# Patient Record
Sex: Male | Born: 1985 | Hispanic: No | Marital: Married | Smoking: Never smoker
Health system: Southern US, Community
[De-identification: ages and names within clinical notes are randomized; demographics above are authoritative.]

---

## 2015-09-12 HISTORY — PX: FRACTURE SURGERY: SHX138

## 2015-10-20 ENCOUNTER — Emergency Department (HOSPITAL_COMMUNITY): Payer: Self-pay

## 2015-10-20 ENCOUNTER — Encounter (HOSPITAL_COMMUNITY): Payer: Self-pay | Admitting: *Deleted

## 2015-10-20 ENCOUNTER — Emergency Department (HOSPITAL_COMMUNITY)
Admission: EM | Admit: 2015-10-20 | Discharge: 2015-10-20 | Disposition: A | Discharge status: Home / Self Care | Payer: Self-pay | Attending: Emergency Medicine | Admitting: Emergency Medicine

## 2015-10-20 DIAGNOSIS — Y9289 Other specified places as the place of occurrence of the external cause: Secondary | ICD-10-CM | POA: Insufficient documentation

## 2015-10-20 DIAGNOSIS — Y9389 Activity, other specified: Secondary | ICD-10-CM | POA: Insufficient documentation

## 2015-10-20 DIAGNOSIS — Y998 Other external cause status: Secondary | ICD-10-CM | POA: Insufficient documentation

## 2015-10-20 DIAGNOSIS — W19XXXA Unspecified fall, initial encounter: Secondary | ICD-10-CM

## 2015-10-20 DIAGNOSIS — S52121A Displaced fracture of head of right radius, initial encounter for closed fracture: Secondary | ICD-10-CM | POA: Insufficient documentation

## 2015-10-20 DIAGNOSIS — W01198A Fall on same level from slipping, tripping and stumbling with subsequent striking against other object, initial encounter: Secondary | ICD-10-CM | POA: Insufficient documentation

## 2015-10-20 DIAGNOSIS — R1110 Vomiting, unspecified: Secondary | ICD-10-CM | POA: Insufficient documentation

## 2015-10-20 DIAGNOSIS — R42 Dizziness and giddiness: Secondary | ICD-10-CM | POA: Insufficient documentation

## 2015-10-20 LAB — CBC
HCT: 43.3 % (ref 39.0–52.0)
Hemoglobin: 14.5 g/dL (ref 13.0–17.0)
MCH: 24.6 pg — AB (ref 26.0–34.0)
MCHC: 33.5 g/dL (ref 30.0–36.0)
MCV: 73.5 fL — AB (ref 78.0–100.0)
PLATELETS: 284 10*3/uL (ref 150–400)
RBC: 5.89 MIL/uL — ABNORMAL HIGH (ref 4.22–5.81)
RDW: 13.4 % (ref 11.5–15.5)
WBC: 7.7 10*3/uL (ref 4.0–10.5)

## 2015-10-20 LAB — BASIC METABOLIC PANEL
Anion gap: 12 (ref 5–15)
BUN: 11 mg/dL (ref 6–20)
CHLORIDE: 105 mmol/L (ref 101–111)
CO2: 21 mmol/L — AB (ref 22–32)
CREATININE: 1.55 mg/dL — AB (ref 0.61–1.24)
Calcium: 9.5 mg/dL (ref 8.9–10.3)
GFR calc Af Amer: >60 mL/min (ref >60)
GFR calc non Af Amer: 59 mL/min — ABNORMAL LOW (ref >60)
Glucose, Bld: 96 mg/dL (ref 65–99)
Potassium: 4.6 mmol/L (ref 3.5–5.1)
Sodium: 138 mmol/L (ref 135–145)

## 2015-10-20 LAB — CBG MONITORING, ED: Glucose-Capillary: 86 mg/dL (ref 65–99)

## 2015-10-20 MED ORDER — MORPHINE SULFATE (PF) 2 MG/ML IV SOLN
2.0000 mg | Freq: Once | INTRAVENOUS | Status: AC
  Administered 2015-10-20: 2 mg via INTRAVENOUS
  Filled 2015-10-20: qty 1

## 2015-10-20 MED ORDER — OXYCODONE-ACETAMINOPHEN 5-325 MG PO TABS: 2.0000 | ORAL_TABLET | ORAL | Status: DC | PRN

## 2015-10-20 MED ORDER — SODIUM CHLORIDE 0.9 % IV BOLUS (SEPSIS)
1000.0000 mL | Freq: Once | INTRAVENOUS | Status: AC
  Administered 2015-10-20: 1000 mL via INTRAVENOUS

## 2015-10-20 NOTE — ED Provider Notes (Signed)
CSN: [id number]     Arrival date & time 10/20/15  1356 History   First MD Initiated Contact with Patient 10/20/15 1412     Chief Complaint  Patient presents with  . Dizziness  . Hypotension   (Consider location/radiation/quality/duration/timing/severity/associated sxs/prior Treatment) Patient is a 30 y.o. male presenting with dizziness. The history is provided by the patient. No language interpreter was used.  Dizziness Associated symptoms: vomiting   Associated symptoms: no chest pain, no nausea and no shortness of breath    Jonathon Vasquez is a 30 year old male who presents via EMS for trip and fall of her speed bump while chasing someone.  He is a Emergency planning/management officer. He states he became lightheaded and dizzy afterwards and vomited once. Per EMS his blood pressure was 70/40 and he was given Zofran. He is also complaining of right elbow pain as he caught himself with the right hand while tripping. He denies any head injury or loss of consciousness. Last tetanus was 5 years ago. Patient is right-handed. He denies any numbness or tingling.   History reviewed. No pertinent past medical history. History reviewed. No pertinent past surgical history. No family history on file. Social History  Substance Use Topics  . Smoking status: Never Smoker   . Smokeless tobacco: None  . Alcohol Use: No    Review of Systems  Respiratory: Negative for shortness of breath.   Cardiovascular: Negative for chest pain.  Gastrointestinal: Positive for vomiting. Negative for nausea and abdominal pain.  Musculoskeletal: Positive for myalgias and arthralgias. Negative for back pain.  Neurological: Positive for dizziness.  All other systems reviewed and are negative.     Allergies  Review of patient's allergies indicates no known allergies.  Home Medications   Prior to Admission medications   Medication Sig Start Date End Date Taking? Authorizing Provider  oxyCODONE-acetaminophen (PERCOCET/ROXICET) 5-325 MG  tablet Take 2 tablets by mouth every 4 (four) hours as needed for severe pain. 10/20/15   Devantae Babe Patel-Mills, PA-C   BP 111/81 mmHg  Pulse 93  Temp(Src) 97.6 F (36.4 C) (Oral)  Resp 15  Ht  (1.727 m)  Wt 77.111 kg  BMI 25.85 kg/m2  SpO2 100% Physical Exam  Constitutional: He is oriented to person, place, and time. He appears well-developed and well-nourished.  HENT:  Head: Normocephalic and atraumatic.  Eyes: Conjunctivae are normal.  Neck: Normal range of motion. Neck supple.  Cardiovascular: Normal rate, regular rhythm and normal heart sounds.   Pulmonary/Chest: Effort normal and breath sounds normal.  Abdominal: Soft. There is no tenderness.  Musculoskeletal: He exhibits tenderness.  Right arm: Unable to flex past 90 at the elbow. In with palpation of the proximal radial and ulna. Joint effusion or swelling. No wrist pain. 2+ radial pulses. Less than 2 second capillary refill. 5 out of 5 grip strength. Able to move shoulder without difficulty or pain.  Abrasions along the palmar surface of the hand.  Moving lower extremities without difficulty.  Neurological: He is alert and oriented to person, place, and time.  Skin: Skin is warm and dry.  Nursing note and vitals reviewed.   ED Course  Procedures (including critical care time) Labs Review Labs Reviewed  BASIC METABOLIC PANEL - Abnormal; Notable for the following:    CO2 21 (*)    Creatinine, Ser 1.55 (*)    GFR calc non Af Amer 59 (*)    All other components within normal limits  CBC - Abnormal; Notable for the following:  RBC 5.89 (*)    MCV 73.5 (*)    MCH 24.6 (*)    All other components within normal limits  CBG MONITORING, ED    Imaging Review Dg Elbow Complete Right  10/20/2015  CLINICAL DATA:  Tripped and fell chasing another person. Pain and swelling. EXAM: RIGHT ELBOW - COMPLETE 3+ VIEW COMPARISON:  None. FINDINGS: There is a radial head fracture with 2 mm of depression. There is a joint effusion.  No humeral or radial fractures seen. IMPRESSION: Radial head fracture extending to the articular surface with 2 mm of depression. Joint effusion. Electronically Signed   By: Paulina Fusi M.D.   On: 10/20/2015 15:24   I have personally reviewed and evaluated these images and lab results as part of my medical decision-making.   EKG Interpretation None      MDM   Final diagnoses:  Fall  Radial head fracture, right, closed, initial encounter   Patient presents for right arm injury after fall while chasing someone. Patient is calm. He was hypotensive and dizzy while in route with EMS. He was given a bolus of fluids while in the ED and  of morphine. He is neurovascularly intact and exam is not concerning. Denies loss of consciousness or head injury. X-ray of the right elbow shows radial head fracture extending to the intra-articular surface with 2 mm of depression. He also has a joint effusion. Patient was given arm sling. I discussed follow-up with orthopedics. He was told to take Tylenol or Motrin for pain and Percocet for breakthrough pain. He was also given a work note. He is currently unable to drive or do his job. Return precautions were discussed as well as pain management and patient agrees with plan.  Medications  sodium chloride 0.9 % bolus 1,000 mL (1,000 mLs Intravenous New Bag/Given 10/20/15 1500)  morphine 2 MG/ML injection 2 mg (2 mg Intravenous Given 10/20/15 1500)   Filed Vitals:   10/20/15 1600 10/20/15 1607  BP: 111/81   Pulse: 94 93  Temp:    Resp: 14 [address] PA-C 10/20/15 1622  Alvira Monday, MD 10/21/15 [phone]

## 2015-10-20 NOTE — ED Notes (Addendum)
Cleaned pt hand with saline, then wrapped pt hand with gauze and curlex. Arm sling at bedside.

## 2015-10-20 NOTE — ED Notes (Signed)
Pt presents via [facility] for hypotension and dizziness.  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  Pt then became lightheaded, dizzy, and began vomiting.  EMS arrived BP 70/40, 4 Zofran given and 300cc NS.  Pt also c/o right elbow pain, tripped on speed bump and feel onto arm, -LOC.  BP-90/50 P-111 R-20, EKG ST, A x 4, NAD.  18 gLAC.

## 2015-10-20 NOTE — ED Notes (Signed)
Patient transported to X-ray 

## 2015-10-20 NOTE — Discharge Instructions (Signed)
Radial Head Fracture Follow-up with orthopedics. Take Tylenol or Motrin for pain and Percocet for breakthrough pain. Do not drive or work while using this medication. A radial head fracture is a break of the radius bone in the forearm. The radial head is the part of the bone near the elbow. These breaks often happen during a fall when you land on the outstretched arm.  HOME CARE  Raise (elevate) the injured part while sitting or lying down.  Put ice on the injured area.  Put ice in a plastic bag.  Place a towel between your skin and the bag.  Leave the ice on for 15-20 minutes, 03-04 times a day.  Move your fingers.  If you have a plaster or fiberglass cast:  Do not try to scratch the skin under the cast.  Check the skin around the cast every day. Put lotion on any red or sore areas if needed.  Keep your cast dry and clean.  If you have a plaster splint:  Wear the splint as told.  Loosen the elastic around the splint if your fingers become numb, tingle, or turn cold or blue.  Do not put pressure on any part of your cast or splint. Rest your cast on a pillow for the first 24 hours.  Protect your cast or splint during bathing with a plastic bag. Do not put the cast or splint in water.  Only take medicine as told by your doctor.  Follow up with your doctor. This is important.  Do not over do exercises. GET HELP RIGHT AWAY IF:   Your cast or splint gets damaged or breaks.  You have more pain or puffiness (swelling) than you did before getting the cast.  You have severe pain when stretching your fingers.  There is a bad smell, new stains or yellowish white fluid (pus) coming from under the cast.  Your fingers or hand turn pale, blue, or become cold or lose feeling (numb). MAKE SURE YOU:  Understand these instructions.  Will watch your condition.  Will get help right away if you are not doing well or get worse.   This information is not intended to replace advice  given to you by your health care provider. Make sure you discuss any questions you have with your health care provider.   Document Released: 08/16/2009 Document Revised: 11/20/2011 Document Reviewed: 03/10/2015 Elsevier Interactive Patient Education [employer]

## 2016-06-26 ENCOUNTER — Ambulatory Visit (INDEPENDENT_AMBULATORY_CARE_PROVIDER_SITE_OTHER): Payer: Worker's Compensation | Admitting: Orthopaedic Surgery

## 2016-06-26 DIAGNOSIS — S52121D Displaced fracture of head of right radius, subsequent encounter for closed fracture with routine healing: Secondary | ICD-10-CM | POA: Diagnosis not present

## 2016-06-26 DIAGNOSIS — M25521 Pain in right elbow: Secondary | ICD-10-CM

## 2016-07-13 ENCOUNTER — Other Ambulatory Visit (INDEPENDENT_AMBULATORY_CARE_PROVIDER_SITE_OTHER): Payer: Self-pay

## 2016-07-13 DIAGNOSIS — S5291XA Unspecified fracture of right forearm, initial encounter for closed fracture: Secondary | ICD-10-CM

## 2016-07-14 ENCOUNTER — Telehealth (INDEPENDENT_AMBULATORY_CARE_PROVIDER_SITE_OTHER): Payer: Self-pay | Admitting: Orthopaedic Surgery

## 2016-07-14 DIAGNOSIS — S5291XA Unspecified fracture of right forearm, initial encounter for closed fracture: Secondary | ICD-10-CM

## 2016-07-14 NOTE — Telephone Encounter (Signed)
Annice PihJackie states the patient's order from us states the patient should continue physical therapy and only lists 1 visit. Please reword stating exactly how often this patient needs to go to physical therapy. Ex: Once a week for 4 weeks, otherwise workers comp will not allow patient to continue.

## 2016-07-18 ENCOUNTER — Telehealth (INDEPENDENT_AMBULATORY_CARE_PROVIDER_SITE_OTHER): Payer: Self-pay | Admitting: Orthopaedic Surgery

## 2016-07-18 NOTE — Telephone Encounter (Signed)
Patient calling to find out if he is to continue with physical therapy or not for his R arm. He brought in an evaluation about a week ago.

## 2016-07-18 NOTE — Telephone Encounter (Signed)
Agree with continued light duty and PT- make sure nhe has a F/U visit in next month

## 2016-07-18 NOTE — Telephone Encounter (Signed)
Check with Dr Cleophas DunkerWhitfield, please

## 2016-07-18 NOTE — Telephone Encounter (Signed)
Last dictation noted on 06/26/16 stated he will continue light duty and PT until next scheduled visit.  Please advise

## 2016-07-18 NOTE — Telephone Encounter (Signed)
Please see previous notes from phone calls and advise.

## 2016-07-19 ENCOUNTER — Encounter (INDEPENDENT_AMBULATORY_CARE_PROVIDER_SITE_OTHER): Payer: Self-pay

## 2016-07-19 NOTE — Telephone Encounter (Signed)
Spoke with pt and he needs a work note since he has completed PT. PW wasnts him on light duty and continue with PT until his visit on 07/29/16. Left work note at front desk

## 2016-07-20 ENCOUNTER — Encounter (INDEPENDENT_AMBULATORY_CARE_PROVIDER_SITE_OTHER): Payer: Self-pay

## 2016-07-21 ENCOUNTER — Telehealth (INDEPENDENT_AMBULATORY_CARE_PROVIDER_SITE_OTHER): Payer: Self-pay | Admitting: Orthopaedic Surgery

## 2016-07-21 NOTE — Telephone Encounter (Signed)
Marylu LundJanet from [employer] is requesting office notes from 06/26/16 office visit since patient is a worker's comp patient. Please fax to #[phone]

## 2016-07-25 NOTE — Telephone Encounter (Signed)
Sent these notes 07/20/16

## 2016-07-28 ENCOUNTER — Ambulatory Visit (INDEPENDENT_AMBULATORY_CARE_PROVIDER_SITE_OTHER): Payer: Worker's Compensation | Admitting: Orthopaedic Surgery

## 2016-07-28 ENCOUNTER — Encounter (INDEPENDENT_AMBULATORY_CARE_PROVIDER_SITE_OTHER): Payer: Self-pay | Admitting: Orthopaedic Surgery

## 2016-07-28 VITALS — BP 119/73 | HR 71 | Resp 14 | Ht 68.0 in | Wt 170.0 lb

## 2016-07-28 DIAGNOSIS — S52121D Displaced fracture of head of right radius, subsequent encounter for closed fracture with routine healing: Secondary | ICD-10-CM

## 2016-07-28 NOTE — Progress Notes (Signed)
   Office Visit Note   Patient: Jonathon Vasquez           Date of Birth: 1986/04/23           MRN: [id number] Visit Date: 07/28/2016              Requested by: Jonathon PlumberMoogali M Arvind, Vasquez [phone] [address] [place], [place] [id number] PCP: Jonathon PlumberARVIND,MOOGALI M, Vasquez   Assessment & Plan: Visit Diagnoses: No diagnosis found.  Plan: [name] feels that he is making progress in physical therapy. Continues with the JAZZ splint. 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 He does experience some "shaking" with full quick extension of his elbow.. He denies any numbness or tingling. I will plan to see him back in 1 month for reevaluation continuing physical therapy. He will continue working light duty. Follow-Up Instructions: No Follow-up on file.   Orders:  No orders of the defined types were placed in this encounter.  No orders of the defined types were placed in this encounter.     Procedures: No procedures performed   Clinical Data: No additional findings.   Subjective: No chief complaint on file.   Pt is here for follow up from Right radial head surgery on 10/29/2015.   Pt still in PT, painful  Pt still on light duty.    Review of Systems   Objective: Vital Signs: There were no vitals taken for this visit.  Physical Exam  Ortho Exam right elbow exam reveals full quick pronation and supination and flexion. He lacks about 10 to full extension. Neurologically is intact. The incision is healed nicely. There were no localized areas of tenderness. His only subjective complaints are when he quickly tries to extend his elbow.  Mr. Jonathon Vasquez has had excellent healing of the displaced radial head fracture with some mild loss of elbow extension. 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 Hopefully with continued physical therapy and strengthening it will not be an issue. He has not reached maximal medical improvement  Specialty  Comments:  No specialty comments available.  Imaging: No results found.   PMFS History: There are no active problems to display for this patient.  No past medical history on file.  No family history on file.  No past surgical history on file. Social History   Occupational History  . Not on file.   Social History Main Topics  . Smoking status: Never Smoker  . Smokeless tobacco: Not on file  . Alcohol use No  . Drug use: No  . Sexual activity: Not on file

## 2016-08-07 ENCOUNTER — Telehealth (INDEPENDENT_AMBULATORY_CARE_PROVIDER_SITE_OTHER): Payer: Self-pay | Admitting: Orthopaedic Surgery

## 2016-08-07 NOTE — Telephone Encounter (Signed)
Marylu LundJanet from [employer] called about needing office notes on the patient for his claim. She needs notes from 06/26/16 and 07/28/16 visits. Please fax to [phone] attn: Marylu LundJanet

## 2016-08-08 NOTE — Telephone Encounter (Signed)
Sent notes on 08/08/16

## 2016-08-23 ENCOUNTER — Telehealth: Payer: Self-pay | Admitting: Orthopaedic Surgery

## 2016-08-23 NOTE — Telephone Encounter (Signed)
Patient's scheduled appointment has been moved from 09/01/16 to 09/06/16 due to Dr. Cleophas DunkerWhitfield being out of the office on the 22nd. Patient is requesting that his worker's comp be notified of the appointment on the 27th so he doesn't get questioned as to why he did not show up for appointment on the 22nd. I advised patient that if they called him, to have worker's comp call the office so we could verify the appointment change.

## 2016-08-24 NOTE — Telephone Encounter (Signed)
ok 

## 2016-09-01 ENCOUNTER — Ambulatory Visit (INDEPENDENT_AMBULATORY_CARE_PROVIDER_SITE_OTHER): Payer: Worker's Compensation | Admitting: Orthopaedic Surgery

## 2016-09-06 ENCOUNTER — Ambulatory Visit (INDEPENDENT_AMBULATORY_CARE_PROVIDER_SITE_OTHER): Payer: Worker's Compensation | Admitting: Orthopedic Surgery

## 2016-09-06 ENCOUNTER — Encounter (INDEPENDENT_AMBULATORY_CARE_PROVIDER_SITE_OTHER): Payer: Self-pay | Admitting: Orthopedic Surgery

## 2016-09-06 ENCOUNTER — Ambulatory Visit (INDEPENDENT_AMBULATORY_CARE_PROVIDER_SITE_OTHER): Payer: Self-pay

## 2016-09-06 VITALS — BP 130/82 | HR 100 | Ht 68.0 in | Wt 169.8 lb

## 2016-09-06 DIAGNOSIS — S52121D Displaced fracture of head of right radius, subsequent encounter for closed fracture with routine healing: Secondary | ICD-10-CM

## 2016-09-06 NOTE — Progress Notes (Signed)
Office Visit Note   Patient: Jonathon Vasquez           Date of Birth: [birth year]           MRN: [id number] Visit Date: 09/06/2016              Requested by: Karle PlumberMoogali M Arvind, MD [phone] [address] [place], [place] [id number] PCP: Karle PlumberARVIND,MOOGALI M, MD   Assessment & Plan: Visit Diagnoses:  1. Closed displaced fracture of head of right radius with routine healing, subsequent encounter     Plan:  #1: Reinstitute physical therapy for strengthening and range of motion #2: Continue light duty #3: Follow up in 6 weeks for recheck evaluation.  Follow-Up Instructions: Return in about 6 weeks (around 10/18/2016).   Orders:  Orders Placed This Encounter  Procedures  . XR Elbow Complete Right (3+View)   No orders of the defined types were placed in this encounter.     Procedures: No procedures performed   Clinical Data: No additional findings.   Subjective: Chief Complaint  Patient presents with  . 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 He has had problems with getting his full motion. He's also when trying to do certain maneuvers he has weakness and does have some shaking. He had completed a course of physical therapy previously. He's been trying to do some of his exercises on his own. He has been doing some weight work. He does have some pain in the posterior aspect of his distal humeral area. He continues to try to stretch into full extension. Seen today for evaluation. Pt still has pain in his R elbow when he tries to stretch, worked on during PT    Review of Systems  Constitutional: Negative.   HENT: Negative.   Respiratory: Negative.   Cardiovascular: Negative.   Gastrointestinal: Negative.   Skin: Negative.   Neurological: Negative.     Hematological: Negative.   Psychiatric/Behavioral: Negative.      Objective: Vital Signs: BP 130/82   Pulse 100   Ht 5\' 8"  (1.727 m)   Wt 169 lb 12.1 oz (77 kg)   BMI 25.81 kg/m   Physical Exam  Constitutional: He is oriented to person, place, and time. He appears well-developed and well-nourished.  HENT:  Head: Normocephalic and atraumatic.  Eyes: EOM are normal. Pupils are equal, round, and reactive to light.  Neck:  No carotid bruits  Pulmonary/Chest: Effort normal.  Neurological: He is alert and oriented to person, place, and time.  Skin: Skin is warm and dry.  Psychiatric: He has a normal mood and affect. His behavior is normal. Judgment and thought content normal.    Right Elbow Exam   Range of Motion  Right elbow extension: Lacks 8-10 of full extension.  Flexion: normal  Pronation: normal  Supination: normal   Muscle Strength  Pronation:  4/5  Supination:  4/5   Other  Sensation: normal Pulse: present      Specialty Comments:  No specialty comments available.  Imaging: Xr Elbow Complete Right (3+view)  Result Date: 09/06/2016 Three-view x-ray of the right elbow reveals excellent consolidation of the fracture. I do not see any incongruity at this time. Does have a fleck of calcification laterally at the radial neck.  PMFS History: There are no active problems to display for this patient.  History reviewed. No pertinent past medical history.  History reviewed. No pertinent family history.  History reviewed. No pertinent surgical history. Social History   Occupational History  . Not on file.   Social History Main Topics  . Smoking status: Never Smoker  . Smokeless tobacco: Not on file  . Alcohol use No  . Drug use: No  . Sexual activity: Not on file

## 2016-10-20 ENCOUNTER — Ambulatory Visit (INDEPENDENT_AMBULATORY_CARE_PROVIDER_SITE_OTHER): Payer: Worker's Compensation | Admitting: Orthopaedic Surgery

## 2016-10-27 NOTE — Progress Notes (Signed)
   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 Certainly could have some limitations in particular firing a weapon because he lacks about 10-12 to full elbow extension . He is fast approaching medical improvement and we'll have a permanent partial impairment of approximately 20% of the right upper extremity. I would consider an FCE if necessary  Follow-Up Instructions: No Follow-up on file.   Orders:  No orders of the defined types were placed in this encounter.  No orders of the defined types were placed in this encounter.     Procedures: No procedures performed   Clinical Data: No additional findings.   Subjective: No chief complaint on file.    a radial head fracture requiring open reduction internal fixation about 10-1/2 months ago. 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 He has had problems with getting his full motion  Jonathon Vasquez continues with his physical therapy. He still lacks about 10-12 of full elbow extension. Really not having much pain. He is working light duty and has been for about 3-4 months but is has some concerns about his ability to fire a weapon with a lack of extension  Review of Systems   Objective: Vital Signs: There were no vitals taken for this visit.  Physical Exam  Ortho Exam right elbow exam full pronation and supination. No pain. Good grip and release. Flexion over 120. Lacks about 10-12 to full elbow extension  which is about the same and has been in the past. Skin intact. No pain over the radial head no popping or clicking. M intact.  Specialty Comments:  No specialty comments available.  Imaging: No results found.   PMFS History: There are no active problems to display for this patient.  No past medical history on file.  No family history on file.  No past surgical history on file. Social History   Occupational History  . Not on file.   Social History Main Topics  . Smoking status: Never Smoker  . Smokeless tobacco: Not on file  . Alcohol use No  . Drug use: No  . Sexual activity: Not on file

## 2016-10-30 ENCOUNTER — Ambulatory Visit (INDEPENDENT_AMBULATORY_CARE_PROVIDER_SITE_OTHER): Payer: Worker's Compensation | Admitting: Orthopaedic Surgery

## 2016-10-30 ENCOUNTER — Encounter (INDEPENDENT_AMBULATORY_CARE_PROVIDER_SITE_OTHER): Payer: Self-pay | Admitting: Orthopaedic Surgery

## 2016-10-30 VITALS — BP 132/88 | HR 93 | Resp 14 | Ht 69.0 in | Wt 169.0 lb

## 2016-10-30 DIAGNOSIS — S42401S Unspecified fracture of lower end of right humerus, sequela: Secondary | ICD-10-CM | POA: Diagnosis not present

## 2016-11-27 ENCOUNTER — Ambulatory Visit (INDEPENDENT_AMBULATORY_CARE_PROVIDER_SITE_OTHER): Payer: Worker's Compensation | Admitting: Orthopaedic Surgery

## 2016-11-27 ENCOUNTER — Encounter (INDEPENDENT_AMBULATORY_CARE_PROVIDER_SITE_OTHER): Payer: Self-pay | Admitting: Orthopaedic Surgery

## 2016-11-27 VITALS — BP 126/85 | HR 84 | Resp 12 | Ht 69.0 in | Wt 170.0 lb

## 2016-11-27 DIAGNOSIS — S42401D Unspecified fracture of lower end of right humerus, subsequent encounter for fracture with routine healing: Secondary | ICD-10-CM | POA: Diagnosis not present

## 2016-11-27 NOTE — Progress Notes (Signed)
Office Visit Note   Patient: Jonathon Vasquez           Date of Birth: [birth year]           MRN: [id number] Visit Date: 11/27/2016              Requested by: Karle PlumberMoogali M Arvind, MD [phone] [address] [place], [place] [id number] PCP: Karle PlumberARVIND,MOOGALI M, MD   Assessment & Plan: Visit Diagnoses: 1 year status post open reduction internal fixation of right radial head fracture with routine healing. 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   Plan: [name] has completed a course of physical therapy at [facility] total of 68 visits with a 95% improvement. The issue for him may or may not be an inability to effectively use a firearm or wrestle with a combatant. He needs to check with human resources regarding his options and get back to me within the next 1-2 weeks. In the meantime and continue working with the same restrictions. Will have a 20% permanent partial impairment to the right upper extremity. The fracture has healed but  does lack some elbow extension. I don't think further therapy or bracing is going to be of any help. 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   Orders:  No orders of the defined types were placed in this encounter.  No orders of the defined types were placed in this encounter.     Procedures: No procedures performed   Clinical Data: No additional findings.   Subjective: No chief complaint on file.   Mr. Jonathon DowningRiaz is a 31 year old male here today for a one year follow up of Right elbow fracture.  He relates from last visit he has spoken with his supervisor and [facility] can return full time and full duty.  Jachai not have any pain but still lacks full extension right elbow extension of 10. 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 Ideally be worthwhile to have him return to his normal activity for a short period time to see if he has any limitations  Review of Systems   Objective: Vital Signs: There were no vitals taken for this visit.  Physical Exam  Ortho Exam left elbow exam essentially unchanged. He has full pronation supination and flexion and he lacks 10 to full elbow extension there is no pain at that point. He has normal strength.Physical therapy report is addended to the chart.  Specialty Comments:  No specialty comments available.  Imaging: No results found.   PMFS History: There are no active problems to display for this patient.  No past medical history on file.  No family history on file.  No past surgical history on file. Social History   Occupational History  . Not on file.   Social History Main Topics  . Smoking status: Never Smoker  . Smokeless tobacco: Never Used  . Alcohol use No  . Drug use: No  . Sexual activity: Not on file

## 2016-12-22 ENCOUNTER — Encounter (INDEPENDENT_AMBULATORY_CARE_PROVIDER_SITE_OTHER): Payer: Self-pay

## 2016-12-22 ENCOUNTER — Ambulatory Visit (INDEPENDENT_AMBULATORY_CARE_PROVIDER_SITE_OTHER): Payer: Worker's Compensation | Admitting: Orthopaedic Surgery

## 2016-12-22 DIAGNOSIS — S52121D Displaced fracture of head of right radius, subsequent encounter for closed fracture with routine healing: Secondary | ICD-10-CM

## 2016-12-22 NOTE — Progress Notes (Signed)
   Office Visit Note   Patient: Hashir Deleeuw           Date of Birth: 29-Apr-1986           MRN: [id number] Visit Date: 12/22/2016              Requested by: Karle Plumber, MD [phone] [address] [place], [place] [address] PCP: Karle Plumber, MD   Assessment & Plan: Visit Diagnoses:  1. Closed displaced fracture of head of right radius with routine healing, subsequent encounter    Mr RiazIs just over a year status post open reduction internal fixation of a right radial head fracture. He actually has done fairly well with a long course of physical therapy. 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 Reached the point where he like to go back to full duty activity but he has to have firearms training. We will give him a note saying that he may return to full duty activity if he passes the firearm training. I will plan to see him back in a month for reevaluation. At that point I think he will of reached maximal medical improvement Plan: Office 1 month  Follow-Up Instructions: Return in about 1 month (around 01/21/2017).   Orders:  No orders of the defined types were placed in this encounter.  No orders of the defined types were placed in this encounter.     Procedures: No procedures performed   Clinical Data: No additional findings.   Subjective: No chief complaint on file. Actually doing fairly well without any significant pain. Continues to work on range of motion excises and strengthening. Eyes any numbness or tingling to the right upper extremity. Still has some mild loss of full extension. No loss of flexion. No pain.  HPI  Review of Systems   Objective: Vital Signs: There were no vitals taken for this visit.  Physical Exam  Ortho Exam right elbow lacked about 6-7 to full extension. Full flexion. Full  pronation and supination. No pain over the radial head. Old incision healed nicely. Neurovascular exam intact distally full range of motion of wrist.  Specialty Comments:  No specialty comments available.  Imaging: No results found.   PMFS History: There are no active problems to display for this patient.  No past medical history on file.  No family history on file.  No past surgical history on file. Social History   Occupational History  . Not on file.   Social History Main Topics  . Smoking status: Never Smoker  . Smokeless tobacco: Never Used  . Alcohol use No  . Drug use: No  . Sexual activity: Not on file

## 2016-12-29 ENCOUNTER — Telehealth (INDEPENDENT_AMBULATORY_CARE_PROVIDER_SITE_OTHER): Payer: Self-pay

## 2016-12-29 NOTE — Telephone Encounter (Signed)
Faxed 12/22/16 office note to adj per her request

## 2017-01-04 ENCOUNTER — Telehealth (INDEPENDENT_AMBULATORY_CARE_PROVIDER_SITE_OTHER): Payer: Self-pay | Admitting: Orthopaedic Surgery

## 2017-01-04 NOTE — Telephone Encounter (Addendum)
Employer wants to know if patient can rtw full duty prior to next appt. 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 Also wants to know if patient can be seen before 5/18 scheduled rov.

## 2017-01-04 NOTE — Telephone Encounter (Signed)
Please advise 

## 2017-01-05 NOTE — Telephone Encounter (Signed)
Jonathon Vasquez return to full duty. Yes can be seen earlier-have him call and schedule an earlier visit

## 2017-01-09 NOTE — Telephone Encounter (Signed)
Front desk to schedule earlier. Called WC and passed on info from PW

## 2017-01-15 ENCOUNTER — Encounter (INDEPENDENT_AMBULATORY_CARE_PROVIDER_SITE_OTHER): Payer: Self-pay | Admitting: Orthopaedic Surgery

## 2017-01-15 ENCOUNTER — Ambulatory Visit (INDEPENDENT_AMBULATORY_CARE_PROVIDER_SITE_OTHER): Payer: Worker's Compensation | Admitting: Orthopaedic Surgery

## 2017-01-15 VITALS — BP 103/66 | HR 77 | Ht 70.0 in | Wt 175.0 lb

## 2017-01-15 DIAGNOSIS — M25521 Pain in right elbow: Secondary | ICD-10-CM

## 2017-01-15 NOTE — Progress Notes (Signed)
   Office Visit Note   Patient: Jonathon Vasquez           Date of Birth: [birth year]           MRN: [id number] Visit Date: 01/15/2017              Requested by: Karle PlumberArvind, Moogali M, MD [phone] [place], [place] [id number] PCP: Karle PlumberArvind, Moogali M, MD   Assessment & Plan: Visit Diagnoses:  1. Pain in right elbow   s/pORIF right radial head fracture. 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 Will see as needed Follow-Up Instructions: Return if symptoms worsen or fail to improve.   Orders:  No orders of the defined types were placed in this encounter.  No orders of the defined types were placed in this encounter.     Procedures: No procedures performed   Clinical Data: No additional findings.   Subjective: Chief Complaint  Patient presents with  . 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  HPI  Review of Systems   Objective: Vital Signs: There were no vitals taken for this visit.  Physical Exam  Ortho Exam No pain with range of motion of right elbow. Lacks just a few degrees to full extension but has full pronation and supination. Good grip and release. Neurovascular exam intact. Specialty Comments:  No specialty comments available.  Imaging: No results found.   PMFS History: There are no active problems to display for this patient.  No past medical history on file.  No family history on file.  No past surgical history on file. Social History   Occupational History  . Not on file.   Social History Main Topics  . Smoking status: Never Smoker  . Smokeless tobacco: Never Used  . Alcohol use No  . Drug use: No  . Sexual activity: Not on file     Valeria BatmanPeter W Lesly Pontarelli, MD   Note - This record has been  created using [employer] software.  Chart creation errors have been sought, but may not always  have been located. Such creation errors do not reflect on  the standard of medical care.

## 2017-01-26 ENCOUNTER — Ambulatory Visit (INDEPENDENT_AMBULATORY_CARE_PROVIDER_SITE_OTHER): Payer: Worker's Compensation | Admitting: Orthopaedic Surgery
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
## 2017-06-06 ENCOUNTER — Encounter (HOSPITAL_COMMUNITY): Payer: Self-pay | Admitting: *Deleted

## 2017-06-06 ENCOUNTER — Emergency Department (HOSPITAL_COMMUNITY)
Admission: EM | Admit: 2017-06-06 | Discharge: 2017-06-06 | Disposition: A | Discharge status: Home / Self Care | Payer: Self-pay | Attending: Emergency Medicine | Admitting: Emergency Medicine

## 2017-06-06 DIAGNOSIS — Y929 Unspecified place or not applicable: Secondary | ICD-10-CM | POA: Insufficient documentation

## 2017-06-06 DIAGNOSIS — Y999 Unspecified external cause status: Secondary | ICD-10-CM | POA: Insufficient documentation

## 2017-06-06 DIAGNOSIS — Y939 Activity, unspecified: Secondary | ICD-10-CM | POA: Insufficient documentation

## 2017-06-06 DIAGNOSIS — R55 Syncope and collapse: Secondary | ICD-10-CM | POA: Insufficient documentation

## 2017-06-06 NOTE — ED Notes (Signed)
Pt stated to this writer that he did not want to wait to be seen
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
## 2019-12-11 ENCOUNTER — Ambulatory Visit: Payer: Self-pay | Attending: Internal Medicine
